# Patient Record
Sex: Male | Born: 1960 | Race: White | Hispanic: No | Marital: Single | Smoking: Current every day smoker
Health system: Southern US, Community
[De-identification: ages and names within clinical notes are randomized; demographics above are authoritative.]

## PROBLEM LIST (undated history)

## (undated) DIAGNOSIS — N289 Disorder of kidney and ureter, unspecified: Secondary | ICD-10-CM

## (undated) DIAGNOSIS — E119 Type 2 diabetes mellitus without complications: Secondary | ICD-10-CM

## (undated) HISTORY — PX: LITHOTRIPSY: SUR834

---

## 1998-06-20 ENCOUNTER — Emergency Department (HOSPITAL_COMMUNITY): Admission: EM | Admit: 1998-06-20 | Discharge: 1998-06-20 | Payer: Self-pay | Admitting: Emergency Medicine

## 2009-12-17 ENCOUNTER — Emergency Department (HOSPITAL_COMMUNITY): Admission: EM | Admit: 2009-12-17 | Discharge: 2009-12-17 | Payer: Self-pay | Admitting: Emergency Medicine

## 2010-01-04 ENCOUNTER — Ambulatory Visit (HOSPITAL_COMMUNITY): Admission: RE | Admit: 2010-01-04 | Discharge: 2010-01-04 | Payer: Self-pay | Admitting: Urology

## 2010-05-30 LAB — CBC
MCH: 30.4 pg (ref 26.0–34.0)
MCHC: 35.4 g/dL (ref 30.0–36.0)
Platelets: 197 10*3/uL (ref 150–400)
RBC: 5.18 MIL/uL (ref 4.22–5.81)

## 2010-05-30 LAB — BASIC METABOLIC PANEL
CO2: 24 mEq/L (ref 19–32)
Calcium: 9 mg/dL (ref 8.4–10.5)
Creatinine, Ser: 0.93 mg/dL (ref 0.4–1.5)
GFR calc Af Amer: >60 mL/min (ref >60)

## 2010-05-30 LAB — GLUCOSE, CAPILLARY: Glucose-Capillary: 290 mg/dL — ABNORMAL HIGH (ref 70–99)

## 2010-05-31 LAB — CBC
MCH: 30.4 pg (ref 26.0–34.0)
MCHC: 34.8 g/dL (ref 30.0–36.0)
Platelets: 211 10*3/uL (ref 150–400)

## 2010-05-31 LAB — URINALYSIS, ROUTINE W REFLEX MICROSCOPIC
Ketones, ur: NEGATIVE mg/dL
Leukocytes, UA: NEGATIVE
Protein, ur: 30 mg/dL — AB
Urobilinogen, UA: 1 mg/dL (ref 0.0–1.0)

## 2010-05-31 LAB — BASIC METABOLIC PANEL
BUN: 7 mg/dL (ref 6–23)
CO2: 27 mEq/L (ref 19–32)
Calcium: 8.7 mg/dL (ref 8.4–10.5)
Creatinine, Ser: 0.84 mg/dL (ref 0.4–1.5)
GFR calc Af Amer: >60 mL/min (ref >60)
Glucose, Bld: 247 mg/dL — ABNORMAL HIGH (ref 70–99)

## 2010-05-31 LAB — DIFFERENTIAL
Basophils Relative: 1 % (ref 0–1)
Eosinophils Absolute: 0.2 10*3/uL (ref 0.0–0.7)
Neutrophils Relative %: 57 % (ref 43–77)

## 2010-05-31 LAB — URINE MICROSCOPIC-ADD ON

## 2012-05-30 ENCOUNTER — Emergency Department (HOSPITAL_COMMUNITY)
Admission: EM | Admit: 2012-05-30 | Discharge: 2012-05-30 | Disposition: A | Discharge status: Home / Self Care | Payer: Managed Care, Other (non HMO) | Attending: Emergency Medicine | Admitting: Emergency Medicine

## 2012-05-30 ENCOUNTER — Encounter (HOSPITAL_COMMUNITY): Payer: Self-pay

## 2012-05-30 DIAGNOSIS — M5431 Sciatica, right side: Secondary | ICD-10-CM

## 2012-05-30 DIAGNOSIS — E119 Type 2 diabetes mellitus without complications: Secondary | ICD-10-CM | POA: Insufficient documentation

## 2012-05-30 DIAGNOSIS — F17200 Nicotine dependence, unspecified, uncomplicated: Secondary | ICD-10-CM | POA: Insufficient documentation

## 2012-05-30 DIAGNOSIS — M5430 Sciatica, unspecified side: Secondary | ICD-10-CM | POA: Insufficient documentation

## 2012-05-30 DIAGNOSIS — R209 Unspecified disturbances of skin sensation: Secondary | ICD-10-CM | POA: Insufficient documentation

## 2012-05-30 HISTORY — DX: Type 2 diabetes mellitus without complications: E11.9

## 2012-05-30 MED ORDER — OXYCODONE-ACETAMINOPHEN 5-325 MG PO TABS: 1.0000 | ORAL_TABLET | ORAL | Status: DC | PRN

## 2012-05-30 MED ORDER — CYCLOBENZAPRINE HCL 10 MG PO TABS: 10.0000 mg | ORAL_TABLET | Freq: Two times a day (BID) | ORAL | Status: DC | PRN

## 2012-05-30 MED ORDER — IBUPROFEN 600 MG PO TABS: 600.0000 mg | ORAL_TABLET | Freq: Three times a day (TID) | ORAL | Status: DC | PRN

## 2012-05-30 NOTE — ED Notes (Signed)
Pt alert x4  c/o back pain x3 days ,pain radiates down rt leg.  Relieved by nothing will monitor.

## 2012-05-30 NOTE — ED Notes (Signed)
Per pt, back pain started Tuesday.  Pt went to work on Wednesday.  Does lifting at work.  No previous hx of same.  Took alieve at home tonight with no relief.

## 2012-05-30 NOTE — ED Provider Notes (Signed)
History     CSN: [id number]  Arrival date & time 05/30/12  4098   First MD Initiated Contact with Patient 05/30/12 0413      Chief Complaint  Patient presents with  . Back Pain     The history is provided by the patient.   patient reports several days of worsening right-sided back pain with radiation down his right buttock.  He reports a numbness in his anterior right thigh.  He denies frank weakness of his right lower extremity but states he notices it more when he walks up steps.  No foot drop.  No recent falls or trauma.  He does do heavy lifting at work.  He does report a slip in the ice 2 weeks ago without fall.  He denies abdominal pain.  No nausea or vomiting.  No fevers or chills.  No urinary complaints.  No saddle anesthesia.  No history of cancer  Past Medical History  Diagnosis Date  . Diabetes mellitus without complication     Past Surgical History  Procedure Laterality Date  . Lithotripsy      History reviewed. No pertinent family history.  History  Substance Use Topics  . Smoking status: Current Every Day Smoker -- 0.50 packs/day    Types: Cigarettes  . Smokeless tobacco: Not on file  . Alcohol Use: No      Review of Systems  Musculoskeletal: Positive for back pain.  All other systems reviewed and are negative.    Allergies  Review of patient's allergies indicates not on file.  Home Medications   Current Outpatient Rx  Name  Route  Sig  Dispense  Refill  . cyclobenzaprine (FLEXERIL) 10 MG tablet   Oral   Take 1 tablet (10 mg total) by mouth 2 (two) times daily as needed for muscle spasms.   20 tablet   0   . ibuprofen (ADVIL,MOTRIN) 600 MG tablet   Oral   Take 1 tablet (600 mg total) by mouth every 8 (eight) hours as needed for pain.   15 tablet   0   . oxyCODONE-acetaminophen (PERCOCET/ROXICET) 5-325 MG per tablet   Oral   Take 1 tablet by mouth every 4 (four) hours as needed for pain.   30 tablet   0     BP 141/75  Pulse 78   Temp(Src) 98.6 F (37 C) (Oral)  Resp 20  SpO2 98%  Physical Exam  Nursing note and vitals reviewed. Constitutional: He is oriented to person, place, and time. He appears well-developed and well-nourished.  HENT:  Head: Normocephalic and atraumatic.  Eyes: EOM are normal.  Neck: Normal range of motion.  Cardiovascular: Normal rate, regular rhythm, normal heart sounds and intact distal pulses.   Pulmonary/Chest: Effort normal and breath sounds normal. No respiratory distress.  Abdominal: Soft. He exhibits no distension. There is no tenderness.  Musculoskeletal: Normal range of motion.  Neurological: He is alert and oriented to person, place, and time.  5 out of 5 strength in bilateral lower extremity major muscle groups  Skin: Skin is warm and dry.  Psychiatric: He has a normal mood and affect. Judgment normal.    ED Course  Procedures (including critical care time)  Labs Reviewed - No data to display No results found.   1. Sciatica, right       MDM  The patient be right-sided sciatica without weakness.  Neurosurgery followup.  Symptomatic treatment at home.  Normal lower extremity neurologic exam. No bowel or bladder complaints.  No back pain red flags. Likely musculoskeletal back pain. Doubt spinal epidural abscess. Doubt cauda equina. Doubt abdominal aortic aneurysm         Lyanne Co, MD 05/30/12 [phone]

## 2012-05-30 NOTE — ED Notes (Addendum)
Pt alert x4 v/s stable discharge papers given will monitor.

## 2015-05-28 ENCOUNTER — Emergency Department (HOSPITAL_BASED_OUTPATIENT_CLINIC_OR_DEPARTMENT_OTHER): Payer: BLUE CROSS/BLUE SHIELD

## 2015-05-28 ENCOUNTER — Encounter (HOSPITAL_BASED_OUTPATIENT_CLINIC_OR_DEPARTMENT_OTHER): Payer: Self-pay | Admitting: Emergency Medicine

## 2015-05-28 ENCOUNTER — Emergency Department (HOSPITAL_BASED_OUTPATIENT_CLINIC_OR_DEPARTMENT_OTHER)
Admission: EM | Admit: 2015-05-28 | Discharge: 2015-05-29 | Disposition: A | Discharge status: Home / Self Care | Payer: BLUE CROSS/BLUE SHIELD | Attending: Emergency Medicine | Admitting: Emergency Medicine

## 2015-05-28 DIAGNOSIS — Z87448 Personal history of other diseases of urinary system: Secondary | ICD-10-CM | POA: Diagnosis not present

## 2015-05-28 DIAGNOSIS — Z791 Long term (current) use of non-steroidal anti-inflammatories (NSAID): Secondary | ICD-10-CM | POA: Insufficient documentation

## 2015-05-28 DIAGNOSIS — F17210 Nicotine dependence, cigarettes, uncomplicated: Secondary | ICD-10-CM | POA: Insufficient documentation

## 2015-05-28 DIAGNOSIS — Z79899 Other long term (current) drug therapy: Secondary | ICD-10-CM | POA: Insufficient documentation

## 2015-05-28 DIAGNOSIS — H578 Other specified disorders of eye and adnexa: Secondary | ICD-10-CM | POA: Diagnosis not present

## 2015-05-28 DIAGNOSIS — Z7984 Long term (current) use of oral hypoglycemic drugs: Secondary | ICD-10-CM | POA: Diagnosis not present

## 2015-05-28 DIAGNOSIS — J209 Acute bronchitis, unspecified: Secondary | ICD-10-CM | POA: Insufficient documentation

## 2015-05-28 DIAGNOSIS — Z7982 Long term (current) use of aspirin: Secondary | ICD-10-CM | POA: Insufficient documentation

## 2015-05-28 DIAGNOSIS — R05 Cough: Secondary | ICD-10-CM | POA: Diagnosis present

## 2015-05-28 DIAGNOSIS — E119 Type 2 diabetes mellitus without complications: Secondary | ICD-10-CM | POA: Diagnosis not present

## 2015-05-28 HISTORY — DX: Disorder of kidney and ureter, unspecified: N28.9

## 2015-05-28 NOTE — ED Notes (Signed)
Patient states that he has had a "head" cold x 2 -3 days. Today it progressed into his chest. The patient states that he is having trouble catching his breath

## 2015-05-29 MED ORDER — ALBUTEROL SULFATE (2.5 MG/3ML) 0.083% IN NEBU
5.0000 mg | INHALATION_SOLUTION | Freq: Once | RESPIRATORY_TRACT | Status: AC
  Administered 2015-05-29: 5 mg via RESPIRATORY_TRACT
  Filled 2015-05-29: qty 6

## 2015-05-29 MED ORDER — ALBUTEROL SULFATE HFA 108 (90 BASE) MCG/ACT IN AERS
2.0000 | INHALATION_SPRAY | RESPIRATORY_TRACT | Status: DC | PRN
  Administered 2015-05-29: 2 via RESPIRATORY_TRACT
  Filled 2015-05-29: qty 6.7

## 2015-05-29 MED ORDER — HYDROCOD POLST-CPM POLST ER 10-8 MG/5ML PO SUER: 5.0000 mL | Freq: Two times a day (BID) | ORAL | Status: DC | PRN

## 2015-05-29 NOTE — ED Provider Notes (Addendum)
CSN: [id number]     Arrival date & time 05/28/15  2127 History  By signing my name below, I, Budd PalmerVanessa Prueter, attest that this documentation has been prepared under the direction and in the presence of Paula LibraJohn Joyce Leckey, MD. Electronically Signed: Budd PalmerVanessa Prueter, ED Scribe. 05/29/2015. 12:23 AM.      Chief Complaint  Patient presents with  . Cough   The history is provided by the patient. No language interpreter was used.   HPI Comments: Benita GutterDennis Prowell is a 55 y.o. male who presents to the Emergency Department complaining of worsening cough onset 3 days ago. He reports associated SOB onset today, as well as chest tightness, rhinorrhea, and itchy eyes. Pt denies n/v/d and fever. Symptoms are moderate. He was evaluated by respiratory therapy on arrival and found to be wheezing. He was given a neb treatment which he is taking at this time.   Past Medical History  Diagnosis Date  . Diabetes mellitus without complication (HCC)   . Renal disorder    Past Surgical History  Procedure Laterality Date  . Lithotripsy     History reviewed. No pertinent family history. Social History  Substance Use Topics  . Smoking status: Current Every Day Smoker -- 0.50 packs/day    Types: Cigarettes  . Smokeless tobacco: None  . Alcohol Use: No    Review of Systems  All other systems reviewed and are negative.   Allergies  Review of patient's allergies indicates no known allergies.  Home Medications   Prior to Admission medications   Medication Sig Start Date End Date Taking? Authorizing Provider  lovastatin (ALTOPREV) 40 MG 24 hr tablet Take 40 mg by mouth at bedtime.   Yes Historical Provider, MD  sitaGLIPtin (JANUVIA) 100 MG tablet Take 100 mg by mouth daily.   Yes Historical Provider, MD  aspirin EC 81 MG tablet Take 81 mg by mouth daily.    Historical Provider, MD  cyclobenzaprine (FLEXERIL) 10 MG tablet Take 1 tablet (10 mg total) by mouth 2 (two) times daily as needed for muscle spasms. 05/30/12    Azalia BilisKevin Campos, MD  glipiZIDE (GLUCOTROL) 10 MG tablet Take 10 mg by mouth 2 (two) times daily before a meal.    Historical Provider, MD  hydrochlorothiazide (HYDRODIURIL) 25 MG tablet Take 25 mg by mouth daily.    Historical Provider, MD  ibuprofen (ADVIL,MOTRIN) 600 MG tablet Take 1 tablet (600 mg total) by mouth every 8 (eight) hours as needed for pain. 05/30/12   Azalia BilisKevin Campos, MD  lisinopril (PRINIVIL,ZESTRIL) 2.5 MG tablet Take 2.5 mg by mouth daily.    Historical Provider, MD  metFORMIN (GLUCOPHAGE) 1000 MG tablet Take 1,000 mg by mouth 2 (two) times daily with a meal.    Historical Provider, MD  naproxen sodium (ALEVE) 220 MG tablet Take 220 mg by mouth 2 (two) times daily with a meal. For pain    Historical Provider, MD  oxyCODONE-acetaminophen (PERCOCET/ROXICET) 5-325 MG per tablet Take 1 tablet by mouth every 4 (four) hours as needed for pain. 05/30/12   Azalia BilisKevin Campos, MD   BP 127/86 mmHg  Pulse 80  Temp(Src) 98.5 F (36.9 C) (Oral)  Resp 18  Ht 5\' 10"  (1.778 m)  Wt 265 lb (120.203 kg)  BMI 38.02 kg/m2  SpO2 98% Physical Exam General: Well-developed, well-nourished male in no acute distress; appearance consistent with age of record HENT: normocephalic; atraumatic; nasal congestion Eyes: pupils equal, round and reactive to light; extraocular muscles intact Neck: supple Heart: regular rate and  rhythm Lungs: faint inspiratory wheezes Abdomen: soft; nondistended; nontender; no masses or hepatosplenomegaly; bowel sounds present Extremities: No deformity; full range of motion; pulses normal Neurologic: Awake, alert and oriented; motor function intact in all extremities and symmetric; no facial droop Skin: Warm and dry Psychiatric: Normal mood and affect   ED Course  Procedures   MDM  1:19 AM Air movement improved after completion of neb treatment.  Nursing notes and vitals signs, including pulse oximetry, reviewed.  Summary of this visit's results, reviewed by  myself:  Imaging Studies: Dg Chest 2 View  05/28/2015  CLINICAL DATA:  Acute onset of difficulty breathing. Initial encounter. EXAM: CHEST  2 VIEW COMPARISON:  None. FINDINGS: The lungs are well-aerated. Mild vascular congestion is noted. There is no evidence of focal opacification, pleural effusion or pneumothorax. The heart is normal in size; the mediastinal contour is within normal limits. No acute osseous abnormalities are seen. IMPRESSION: Mild vascular congestion noted.  Lungs remain grossly clear. Electronically Signed   By: Roanna Raider M.D.   On: 05/28/2015 21:50   I personally performed the services described in this documentation, which was scribed in my presence. The recorded information has been reviewed and is accurate.    Paula Libra, MD 05/29/15 0120  Paula Libra, MD 05/29/15 8119

## 2015-05-29 NOTE — Discharge Instructions (Signed)

## 2018-05-01 ENCOUNTER — Ambulatory Visit: Payer: BLUE CROSS/BLUE SHIELD | Admitting: Sports Medicine

## 2018-05-01 ENCOUNTER — Ambulatory Visit (INDEPENDENT_AMBULATORY_CARE_PROVIDER_SITE_OTHER): Payer: BLUE CROSS/BLUE SHIELD

## 2018-05-01 DIAGNOSIS — M5416 Radiculopathy, lumbar region: Secondary | ICD-10-CM | POA: Diagnosis not present

## 2018-05-01 DIAGNOSIS — M48061 Spinal stenosis, lumbar region without neurogenic claudication: Secondary | ICD-10-CM

## 2018-05-01 MED ORDER — PREDNISONE 50 MG PO TABS: ORAL_TABLET | ORAL | 0 refills | Status: DC

## 2018-05-01 MED ORDER — MELOXICAM 15 MG PO TABS: ORAL_TABLET | ORAL | 3 refills | Status: DC

## 2018-05-01 MED ORDER — GABAPENTIN 300 MG PO CAPS: ORAL_CAPSULE | ORAL | 3 refills | Status: AC

## 2018-05-01 NOTE — Progress Notes (Signed)
Subjective:    I'm seeing this patient as a consultation for: Jacob Palms, NP  CC: Low back pain  HPI: This is a pleasant 58 year old male, for years has had pain in his low back, radiation into both hips, thighs.  Better with leaning forward, moderate, persistent without radiation past the knees.  No bowel or bladder dysfunction, saddle numbness, constitutional symptoms.  He has done some chiropractic manipulation without much improvement.  He was given a shot of Toradol by his PCP also without much treatment.  He is referred here for further evaluation and definitive treatment.  I reviewed the past medical history, family history, social history, surgical history, and allergies today and no changes were needed.  Please see the problem list section below in epic for further details.  Past Medical History: Past Medical History:  Diagnosis Date  . Diabetes mellitus without complication (HCC)   . Renal disorder    Past Surgical History: Past Surgical History:  Procedure Laterality Date  . LITHOTRIPSY     Social History: Social History   Socioeconomic History  . Marital status: Single    Spouse name: Not on file  . Number of children: Not on file  . Years of education: Not on file  . Highest education level: Not on file  Occupational History  . Not on file  Social Needs  . Financial resource strain: Not on file  . Food insecurity:    Worry: Not on file    Inability: Not on file  . Transportation needs:    Medical: Not on file    Non-medical: Not on file  Tobacco Use  . Smoking status: Current Every Day Smoker    Packs/day: 0.50    Types: Cigarettes  Substance and Sexual Activity  . Alcohol use: No  . Drug use: Not on file  . Sexual activity: Not on file  Lifestyle  . Physical activity:    Days per week: Not on file    Minutes per session: Not on file  . Stress: Not on file  Relationships  . Social connections:    Talks on phone: Not on file    Gets together:  Not on file    Attends religious service: Not on file    Active member of club or organization: Not on file    Attends meetings of clubs or organizations: Not on file    Relationship status: Not on file  Other Topics Concern  . Not on file  Social History Narrative  . Not on file   Family History: No family history on file. Allergies: No Known Allergies Medications: See med rec.  Review of Systems: No headache, visual changes, nausea, vomiting, diarrhea, constipation, dizziness, abdominal pain, skin rash, fevers, chills, night sweats, weight loss, swollen lymph nodes, body aches, joint swelling, muscle aches, chest pain, shortness of breath, mood changes, visual or auditory hallucinations.   Objective:   General: Well Developed, well nourished, and in no acute distress.  Neuro:  Extra-ocular muscles intact, able to move all 4 extremities, sensation grossly intact.  Deep tendon reflexes tested were normal. Psych: Alert and oriented, mood congruent with affect. ENT:  Ears and nose appear unremarkable.  Hearing grossly normal. Neck: Unremarkable overall appearance, trachea midline.  No visible thyroid enlargement. Eyes: Conjunctivae and lids appear unremarkable.  Pupils equal and round. Skin: Warm and dry, no rashes noted.  Cardiovascular: Pulses palpable, no extremity edema. Back Exam:  Inspection: Unremarkable  Motion: Flexion 45 deg, Extension 45 deg,  Side Bending to 45 deg bilaterally,  Rotation to 45 deg bilaterally  SLR laying: Negative  XSLR laying: Negative  Palpable tenderness: None. FABER: negative. Sensory change: Gross sensation intact to all lumbar and sacral dermatomes.  Reflexes: 2+ at both patellar tendons, 2+ at achilles tendons, Babinski's downgoing.  Strength at foot  Plantar-flexion: 5/5 Dorsi-flexion: 5/5 Eversion: 5/5 Inversion: 5/5  Leg strength  Quad: 5/5 Hamstring: 5/5 Hip flexor: 5/5 Hip abductors: 5/5  Gait unremarkable.  I personally reviewed a CT  scan of the abdomen and pelvis from 2011, he has severe spinal stenosis from L3-L5.  He also has spinal stenosis in his mid and lower thoracic spine.  There is severe associated facet arthropathy.  Impression and Recommendations:   This case required medical decision making of moderate complexity.  Lumbar spinal stenosis Multilevel spinal stenosis between L3 and L5, noted as severe on CT scan from 2011, likely worse now. X-rays, prednisone, meloxicam, gabapentin up taper. Formal physical therapy at [facility]. Return to see me in 4 weeks, MRI for interventional planning if no better. ___________________________________________ Ihor Austin. Benjamin Stain, M.D., ABFM., CAQSM. Primary Care and Sports Medicine [facility]  Adjunct Professor of Family Medicine  [employer]

## 2018-05-01 NOTE — Assessment & Plan Note (Signed)
Multilevel spinal stenosis between L3 and L5, noted as severe on CT scan from 2011, likely worse now. X-rays, prednisone, meloxicam, gabapentin up taper. Formal physical therapy at [facility] PT. Return to see me in 4 weeks, MRI for interventional planning if no better.

## 2018-05-29 ENCOUNTER — Ambulatory Visit (INDEPENDENT_AMBULATORY_CARE_PROVIDER_SITE_OTHER): Payer: BLUE CROSS/BLUE SHIELD

## 2018-05-29 ENCOUNTER — Ambulatory Visit: Payer: BLUE CROSS/BLUE SHIELD | Admitting: Sports Medicine

## 2018-05-29 ENCOUNTER — Encounter: Payer: Self-pay | Admitting: Sports Medicine

## 2018-05-29 ENCOUNTER — Other Ambulatory Visit: Payer: Self-pay

## 2018-05-29 DIAGNOSIS — M1612 Unilateral primary osteoarthritis, left hip: Secondary | ICD-10-CM | POA: Insufficient documentation

## 2018-05-29 NOTE — Progress Notes (Signed)
Subjective:    CC: Left hip and back pain  HPI: This is a pleasant 58 year old male, he does have some lumbar spinal stenosis.  We treated him with prednisone, meloxicam, gabapentin.  Improved slightly.  Unfortunately he continues to have pain he localizes in his left buttock, with radiation around to the left groin and anterior thigh, worse with prolonged sitting, weightbearing, with significant morning stiffness and gelling.  No bowel or bladder dysfunction, saddle numbness, constitutional symptoms.  I reviewed the past medical history, family history, social history, surgical history, and allergies today and no changes were needed.  Please see the problem list section below in epic for further details.  Past Medical History: Past Medical History:  Diagnosis Date  . Diabetes mellitus without complication (HCC)   . Renal disorder    Past Surgical History: Past Surgical History:  Procedure Laterality Date  . LITHOTRIPSY     Social History: Social History   Socioeconomic History  . Marital status: Single    Spouse name: Not on file  . Number of children: Not on file  . Years of education: Not on file  . Highest education level: Not on file  Occupational History  . Not on file  Social Needs  . Financial resource strain: Not on file  . Food insecurity:    Worry: Not on file    Inability: Not on file  . Transportation needs:    Medical: Not on file    Non-medical: Not on file  Tobacco Use  . Smoking status: Current Every Day Smoker    Packs/day: 0.50    Types: Cigarettes  . Smokeless tobacco: Never Used  Substance and Sexual Activity  . Alcohol use: No  . Drug use: Not on file  . Sexual activity: Not on file  Lifestyle  . Physical activity:    Days per week: Not on file    Minutes per session: Not on file  . Stress: Not on file  Relationships  . Social connections:    Talks on phone: Not on file    Gets together: Not on file    Attends religious service: Not on  file    Active member of club or organization: Not on file    Attends meetings of clubs or organizations: Not on file    Relationship status: Not on file  Other Topics Concern  . Not on file  Social History Narrative  . Not on file   Family History: No family history on file. Allergies: No Known Allergies Medications: See med rec.  Review of Systems: No fevers, chills, night sweats, weight loss, chest pain, or shortness of breath.   Objective:    General: Well Developed, well nourished, and in no acute distress.  Neuro: Alert and oriented x3, extra-ocular muscles intact, sensation grossly intact.  HEENT: Normocephalic, atraumatic, pupils equal round reactive to light, neck supple, no masses, no lymphadenopathy, thyroid nonpalpable.  Skin: Warm and dry, no rashes. Cardiac: Regular rate and rhythm, no murmurs rubs or gallops, no lower extremity edema.  Respiratory: Clear to auscultation bilaterally. Not using accessory muscles, speaking in full sentences. Left hip: ROM IR: 60 Deg, ER: 60 Deg, Flexion: 120 Deg, Extension: 100 Deg, Abduction: 45 Deg, Adduction: 45 Deg Strength IR: 5/5, ER: 5/5, Flexion: 5/5, Extension: 5/5, Abduction: 5/5, Adduction: 5/5 Pelvic alignment unremarkable to inspection and palpation. Standing hip rotation and gait without trendelenburg / unsteadiness. Greater trochanter without tenderness to palpation. No tenderness over piriformis. No SI joint tenderness and  normal minimal SI movement. Mild to moderate pain with internal rotation, concordant with his typical pain.  Procedure: Real-time Ultrasound Guided injection of the left femoroacetabular joint Device: GE Logiq E  Verbal informed consent obtained.  Time-out conducted.  Noted no overlying erythema, induration, or other signs of local infection.  Skin prepped in a sterile fashion.  Local anesthesia: Topical Ethyl chloride.  With sterile technique and under real time ultrasound guidance:  22-gauge  spinal needle advanced to the femoral head/neck junction, contacted bone and injected 1 cc Kenalog 40, 2 cc lidocaine, 2 cc bupivacaine. Completed without difficulty  Pain immediately resolved suggesting accurate placement of the medication.  Advised to call if fevers/chills, erythema, induration, drainage, or persistent bleeding.  Images permanently stored and available for review in the ultrasound unit.  Impression: Technically successful ultrasound guided injection.  Impression and Recommendations:    Primary osteoarthritis of left hip Pain today is referrable more to the hip joint. X-rays, injection. Rehab exercises given. Return in 1 month.   ___________________________________________ Ihor Austin. Benjamin Stain, M.D., ABFM., CAQSM. Primary Care and Sports Medicine [facility]  Adjunct Professor of Family Medicine  [employer]

## 2018-05-29 NOTE — Assessment & Plan Note (Signed)
Pain today is referrable more to the hip joint. X-rays, injection. Rehab exercises given. Return in 1 month.

## 2018-07-03 ENCOUNTER — Ambulatory Visit: Payer: BLUE CROSS/BLUE SHIELD | Admitting: Sports Medicine

## 2018-08-23 ENCOUNTER — Other Ambulatory Visit: Payer: Self-pay | Admitting: Sports Medicine

## 2018-08-23 DIAGNOSIS — M48061 Spinal stenosis, lumbar region without neurogenic claudication: Secondary | ICD-10-CM

## 2019-03-05 ENCOUNTER — Other Ambulatory Visit: Payer: Self-pay | Admitting: Sports Medicine

## 2019-03-05 DIAGNOSIS — M48061 Spinal stenosis, lumbar region without neurogenic claudication: Secondary | ICD-10-CM

## 2019-09-04 ENCOUNTER — Other Ambulatory Visit: Payer: Self-pay | Admitting: Sports Medicine

## 2019-09-04 DIAGNOSIS — M48061 Spinal stenosis, lumbar region without neurogenic claudication: Secondary | ICD-10-CM

## 2019-09-30 IMAGING — DX DG HIP (WITH OR WITHOUT PELVIS) 2-3V LEFT
3 series · 3 of 3 positions shown · non-contrast
Comparison: Radiographs from 1366.

CLINICAL DATA: Left hip and groin pain since October 2017.

EXAM:
DG HIP (WITH OR WITHOUT PELVIS) 2-3V LEFT

[pelvis ap]
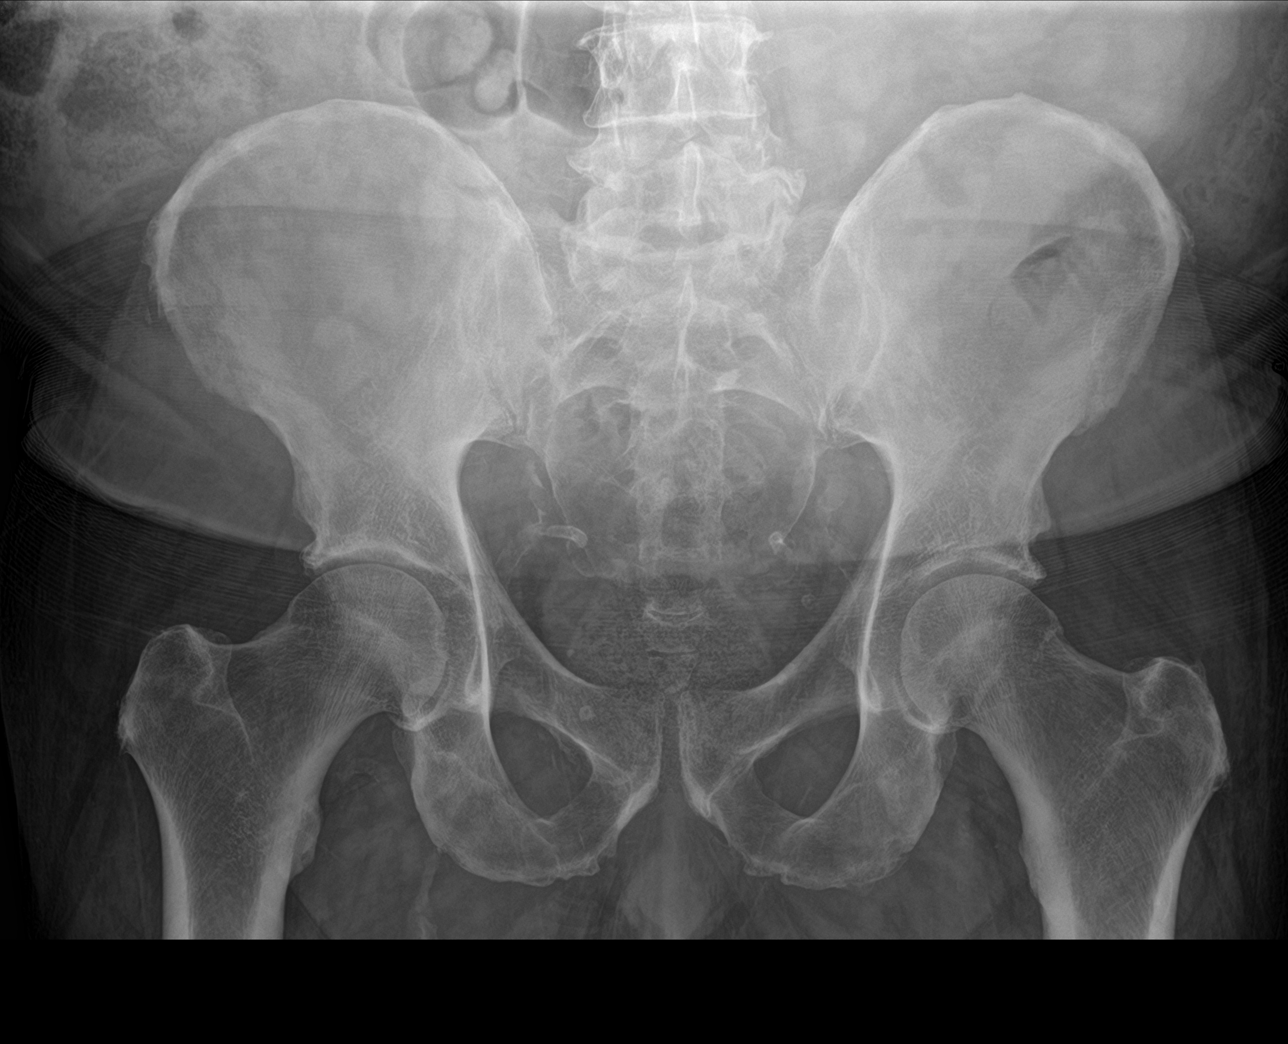

[hip ap]
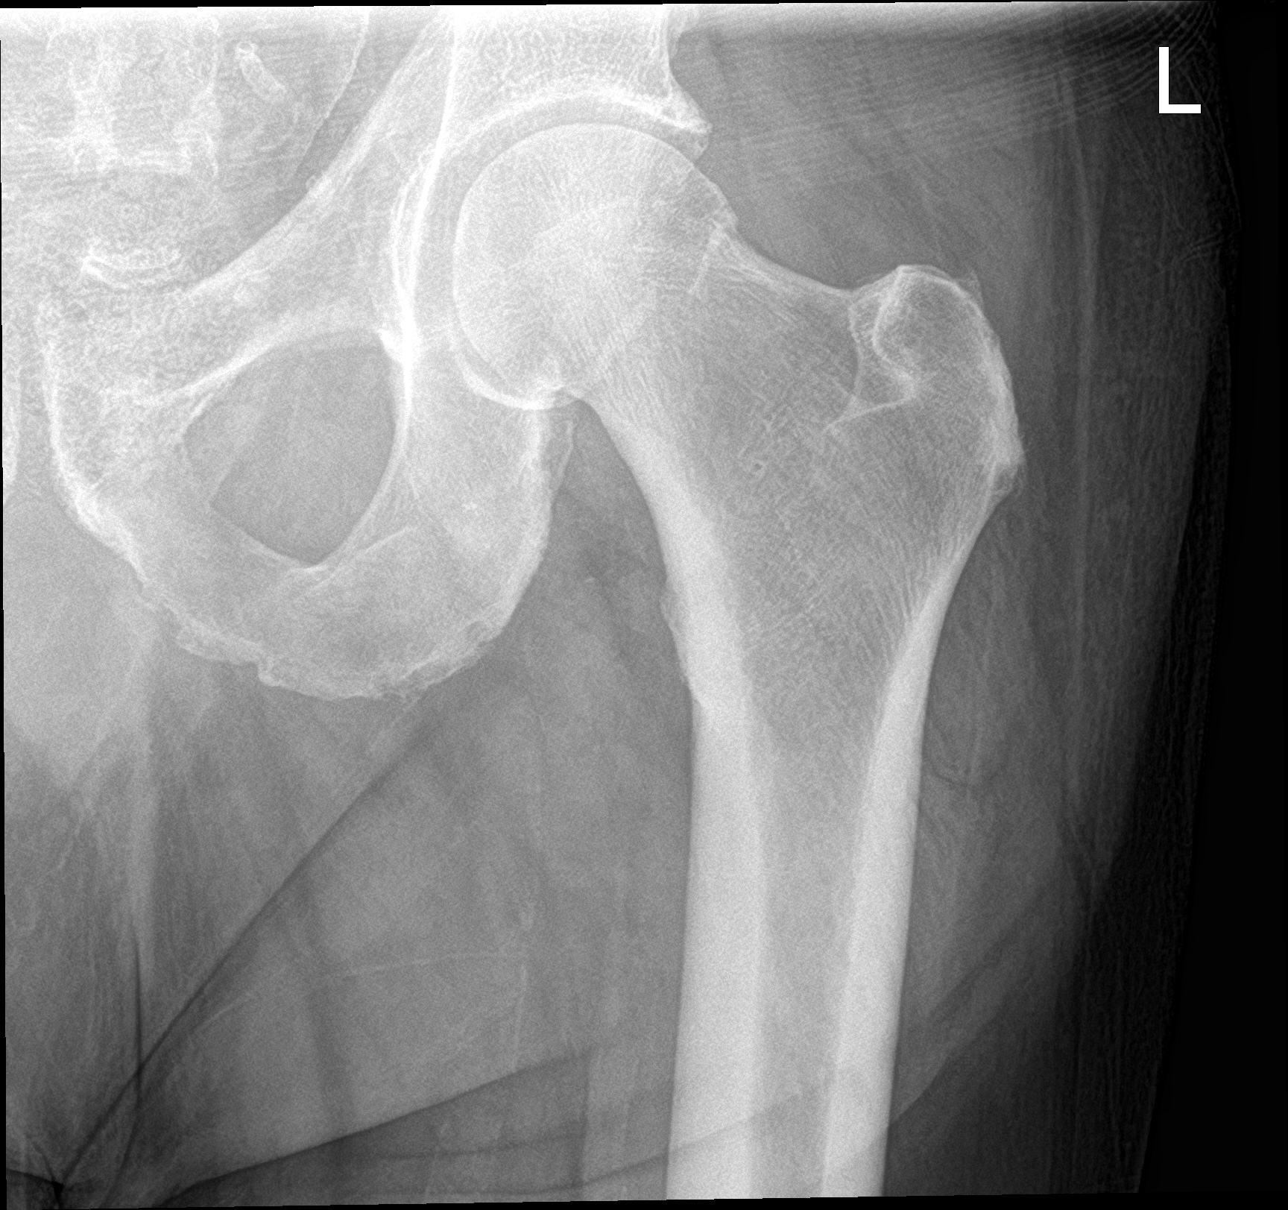

[hip lat]
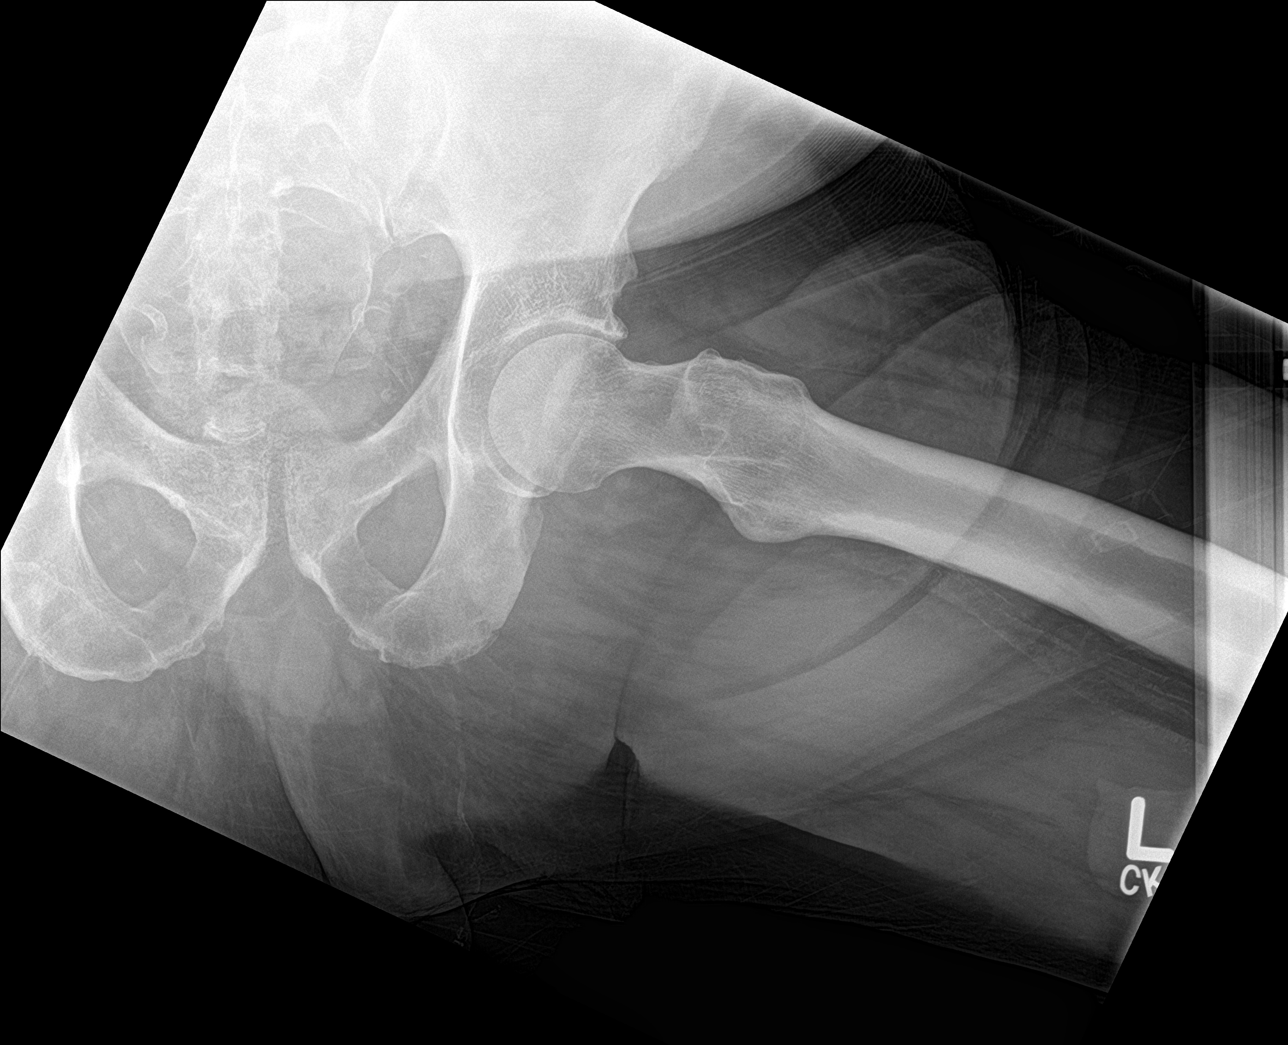

[3 of 3 positions shown; findings below may reference images not displayed]

FINDINGS: Examination limited by clothing artifact.

Both hips are normally located. Mild to moderate degenerative
changes bilaterally with mild joint space narrowing and spurring. No
acute fractures identified. No plain film findings for AVN. The
pubic symphysis and SI joints are intact. No pelvic fractures.
IMPRESSION: No definite hip fractures or AVN.

Mild to moderate degenerative changes.

## 2019-12-31 ENCOUNTER — Other Ambulatory Visit: Payer: Self-pay | Admitting: Sports Medicine

## 2019-12-31 DIAGNOSIS — M48061 Spinal stenosis, lumbar region without neurogenic claudication: Secondary | ICD-10-CM

## 2021-07-25 ENCOUNTER — Other Ambulatory Visit: Payer: Self-pay | Admitting: Sports Medicine

## 2021-07-25 DIAGNOSIS — M48061 Spinal stenosis, lumbar region without neurogenic claudication: Secondary | ICD-10-CM
